# Patient Record
Sex: Male | Born: 1950 | Race: White | Hispanic: No | Marital: Single | Smoking: Former smoker
Health system: Southern US, Community
[De-identification: ages and names within clinical notes are randomized; demographics above are authoritative.]

## PROBLEM LIST (undated history)

## (undated) DIAGNOSIS — E785 Hyperlipidemia, unspecified: Secondary | ICD-10-CM

## (undated) HISTORY — PX: WISDOM TOOTH EXTRACTION: SHX21

## (undated) HISTORY — DX: Hyperlipidemia, unspecified: E78.5

---

## 2003-09-24 ENCOUNTER — Emergency Department (HOSPITAL_COMMUNITY): Admission: AC | Admit: 2003-09-24 | Discharge: 2003-09-24 | Payer: Self-pay

## 2005-03-04 IMAGING — CR DG ANKLE COMPLETE 3+V*L*
3 series · 3 of 3 positions shown · non-contrast
Comparison: none

CLINICAL DATA: Silver trauma with ankle pain.  
 LEFT ANKLE THREE VIEW, 09/24/03
 There is an oblique fracture noted through the left medial malleolus and a transverse fracture through the lateral malleolus.  Overlying soft tissue swelling is noted.  On the lateral view, there also appears to be a fracture through the posterior malleolus.  
 IMPRESSION
 Probable trimalleolar fracture as above.

[view not recorded (1 of 3)]
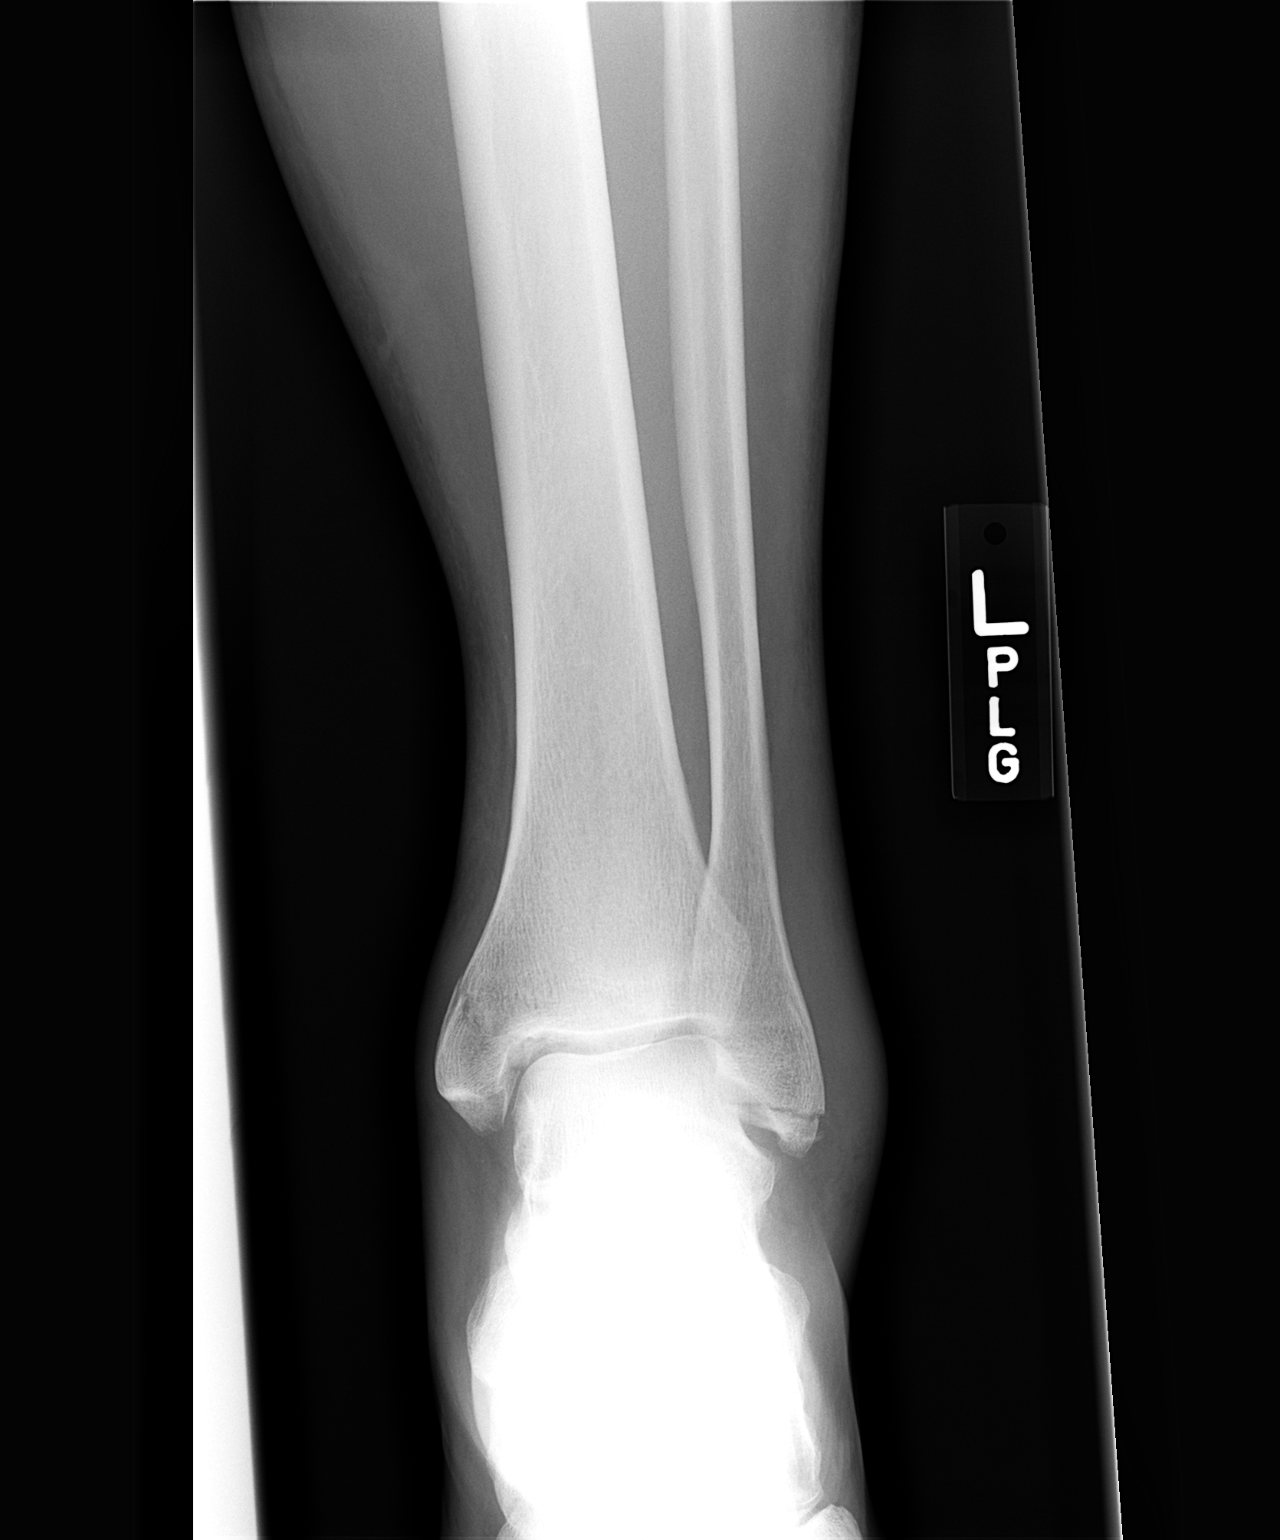

[view not recorded (2 of 3)]
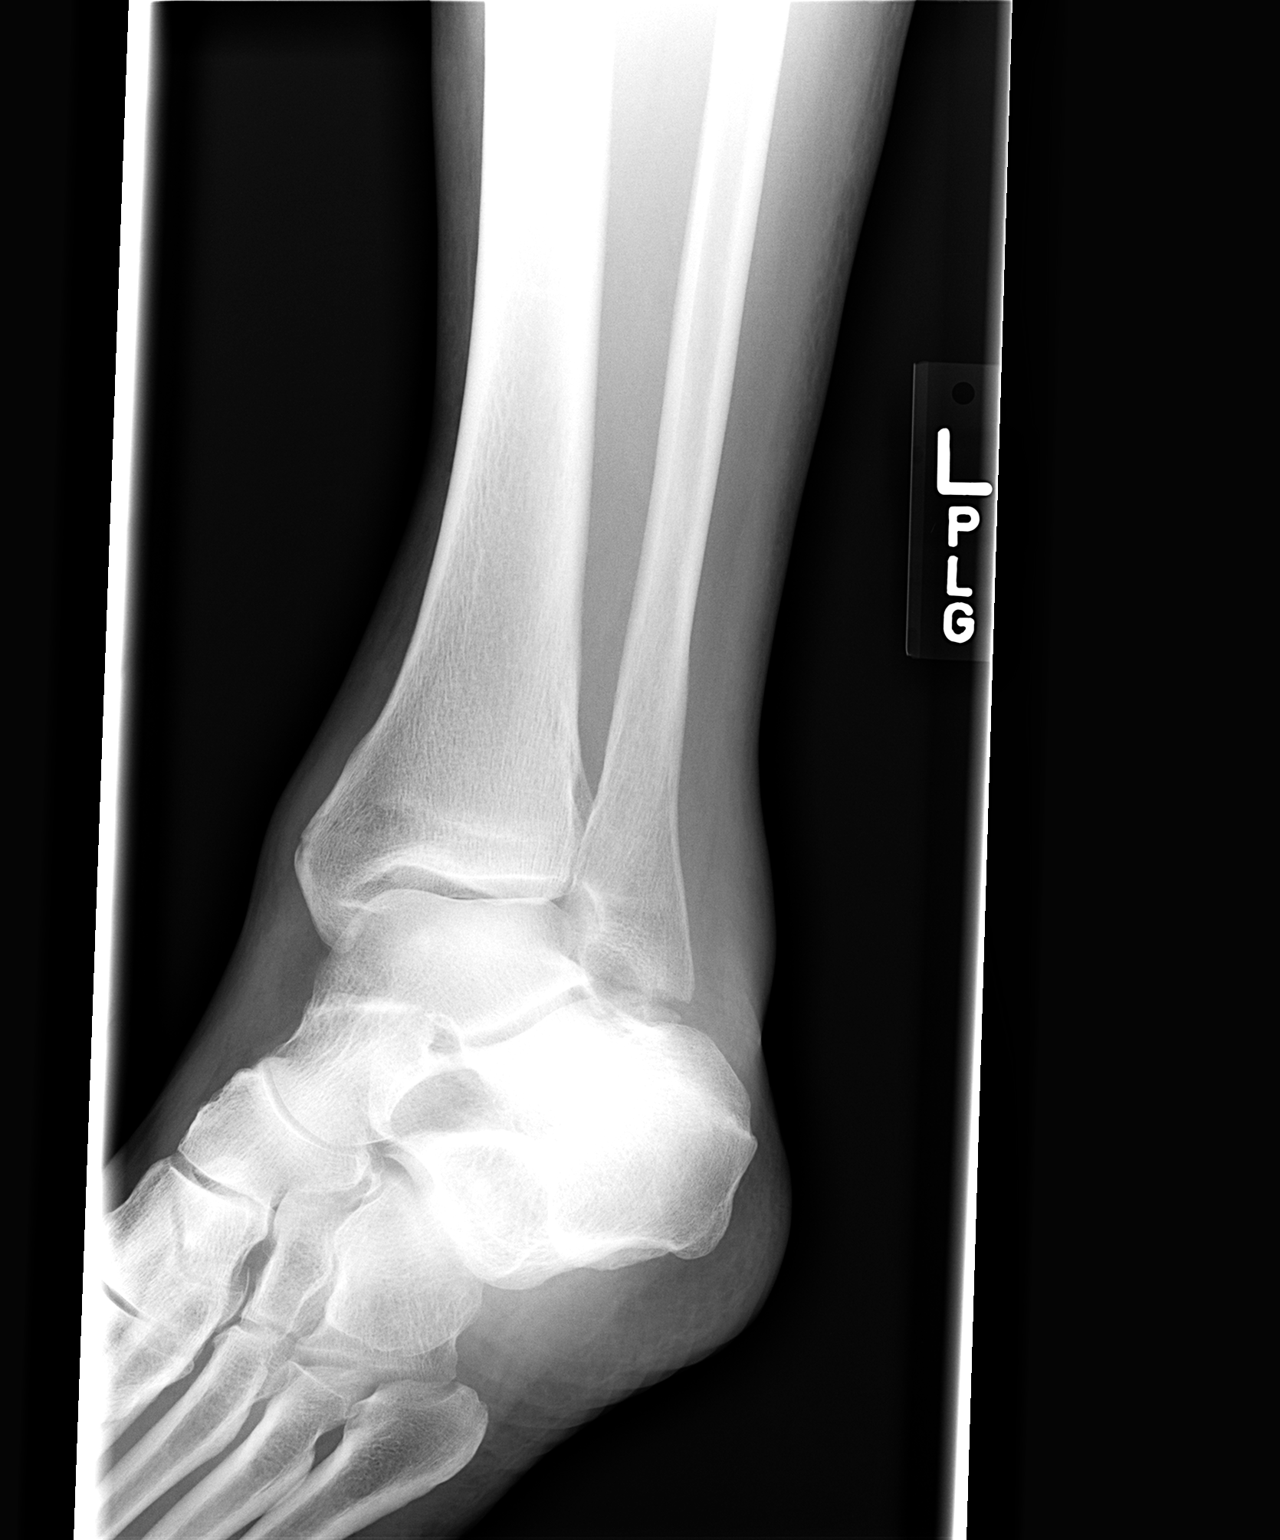

[view not recorded (3 of 3)]
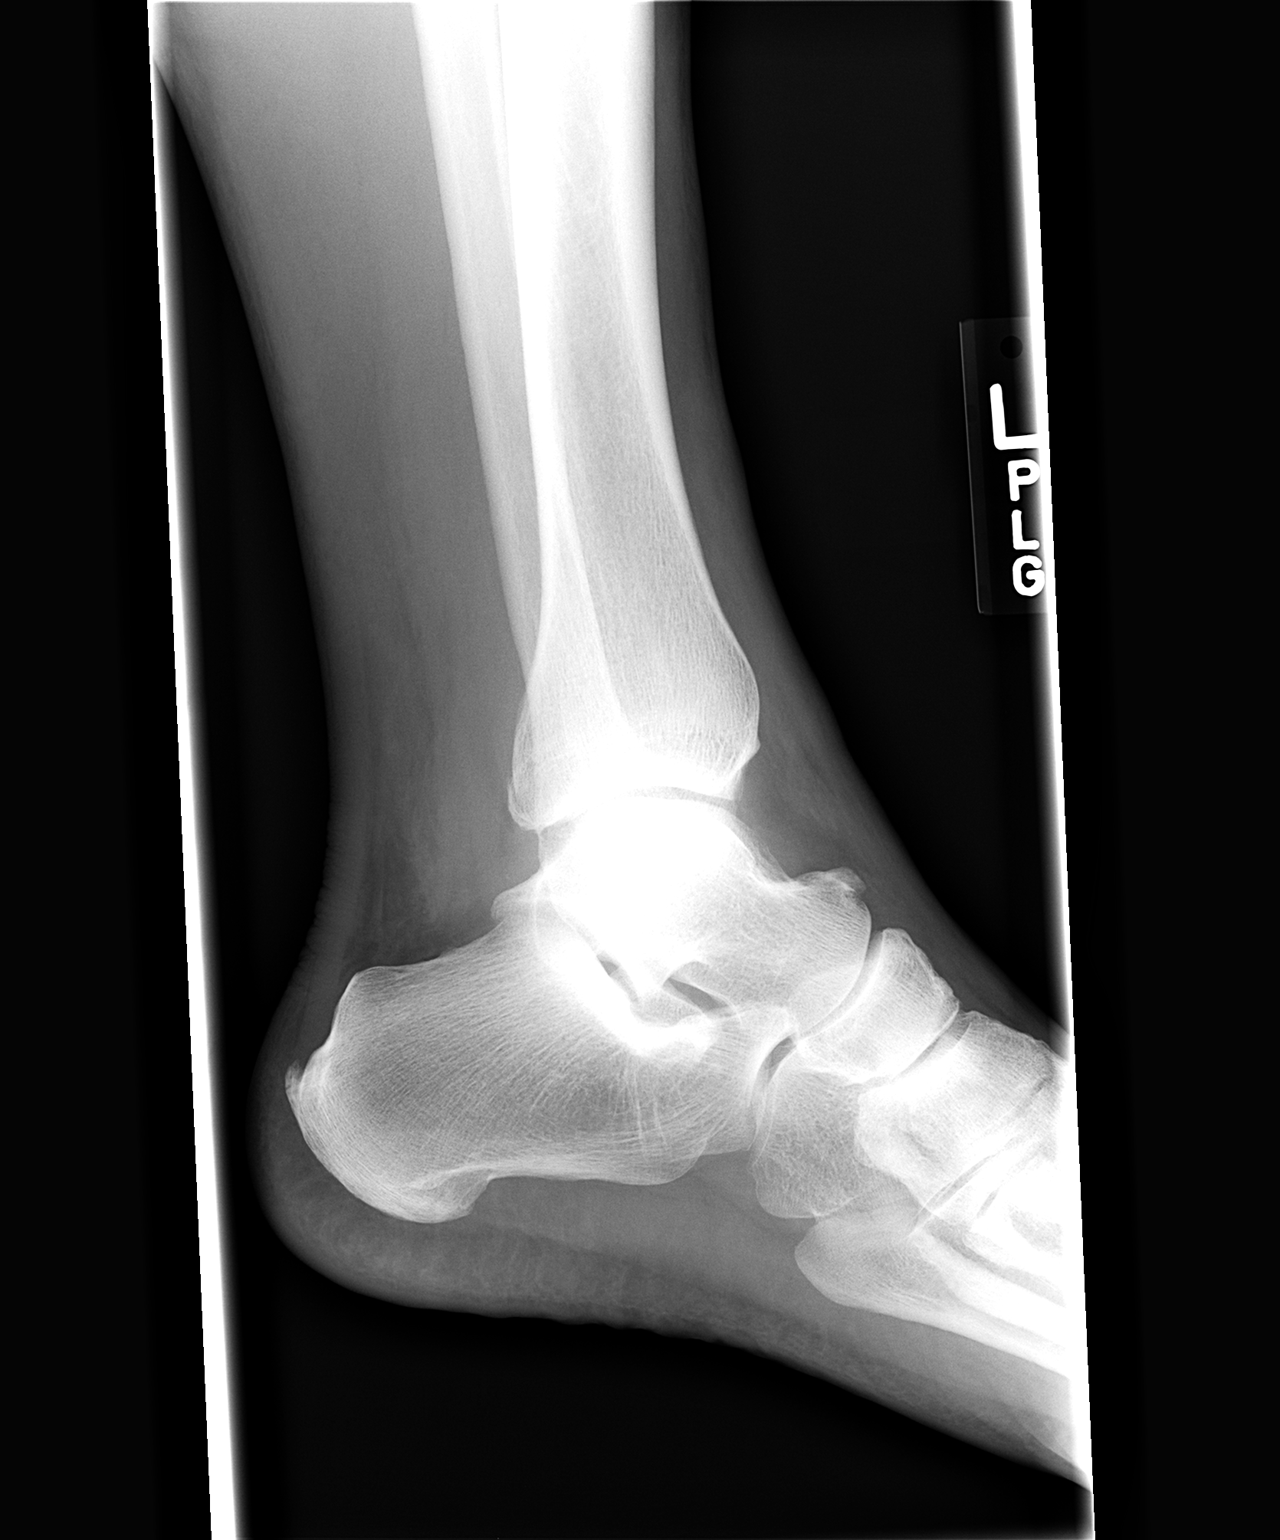

[3 of 3 positions shown; findings below may reference images not displayed]

## 2006-04-03 ENCOUNTER — Emergency Department (HOSPITAL_COMMUNITY): Admission: EM | Admit: 2006-04-03 | Discharge: 2006-04-03 | Payer: Self-pay | Admitting: Emergency Medicine

## 2021-02-20 DIAGNOSIS — E785 Hyperlipidemia, unspecified: Secondary | ICD-10-CM | POA: Diagnosis not present

## 2021-02-20 DIAGNOSIS — R339 Retention of urine, unspecified: Secondary | ICD-10-CM | POA: Diagnosis not present

## 2021-03-05 DIAGNOSIS — M1990 Unspecified osteoarthritis, unspecified site: Secondary | ICD-10-CM | POA: Diagnosis not present

## 2021-03-05 DIAGNOSIS — E782 Mixed hyperlipidemia: Secondary | ICD-10-CM | POA: Diagnosis not present

## 2021-03-05 DIAGNOSIS — F17209 Nicotine dependence, unspecified, with unspecified nicotine-induced disorders: Secondary | ICD-10-CM | POA: Diagnosis not present

## 2021-03-05 DIAGNOSIS — J329 Chronic sinusitis, unspecified: Secondary | ICD-10-CM | POA: Diagnosis not present

## 2022-02-28 DIAGNOSIS — Z0001 Encounter for general adult medical examination with abnormal findings: Secondary | ICD-10-CM | POA: Diagnosis not present

## 2022-02-28 DIAGNOSIS — R7301 Impaired fasting glucose: Secondary | ICD-10-CM | POA: Diagnosis not present

## 2022-02-28 DIAGNOSIS — I10 Essential (primary) hypertension: Secondary | ICD-10-CM | POA: Diagnosis not present

## 2022-02-28 DIAGNOSIS — E785 Hyperlipidemia, unspecified: Secondary | ICD-10-CM | POA: Diagnosis not present

## 2022-03-06 DIAGNOSIS — E875 Hyperkalemia: Secondary | ICD-10-CM | POA: Diagnosis not present

## 2022-03-06 DIAGNOSIS — E785 Hyperlipidemia, unspecified: Secondary | ICD-10-CM | POA: Diagnosis not present

## 2022-03-06 DIAGNOSIS — R7303 Prediabetes: Secondary | ICD-10-CM | POA: Diagnosis not present

## 2022-03-06 DIAGNOSIS — Z0001 Encounter for general adult medical examination with abnormal findings: Secondary | ICD-10-CM | POA: Diagnosis not present

## 2022-09-08 ENCOUNTER — Other Ambulatory Visit: Payer: Self-pay | Admitting: Nurse Practitioner

## 2022-10-08 ENCOUNTER — Ambulatory Visit: Payer: Medicare Other | Admitting: Nurse Practitioner

## 2022-10-13 ENCOUNTER — Ambulatory Visit (INDEPENDENT_AMBULATORY_CARE_PROVIDER_SITE_OTHER): Payer: Medicare Other | Admitting: Nurse Practitioner

## 2022-10-13 VITALS — BP 134/84 | HR 78 | Ht 71.0 in | Wt 186.6 lb

## 2022-10-13 DIAGNOSIS — D229 Melanocytic nevi, unspecified: Secondary | ICD-10-CM | POA: Diagnosis not present

## 2022-10-13 NOTE — Patient Instructions (Signed)
1) Derm referral

## 2022-10-13 NOTE — Progress Notes (Signed)
   Established Patient Office Visit  Subjective:  Patient ID: Jeremy Barrett, male    DOB: [birth year]  Age: 72 y.o. MRN: [id number]  No chief complaint on file.   Acute visit.  Concerned with several discolored nevi.  Family hx.    No other concerns at this time.   No past medical history on file.   Social History   Socioeconomic History   Marital status: Single    Spouse name: Not on file   Number of children: Not on file   Years of education: Not on file   Highest education level: Not on file  Occupational History   Not on file  Tobacco Use   Smoking status: Not on file   Smokeless tobacco: Not on file  Substance and Sexual Activity   Alcohol use: Not on file   Drug use: Not on file   Sexual activity: Not on file  Other Topics Concern   Not on file  Social History Narrative   Not on file   Social Determinants of Health   Financial Resource Strain: Not on file  Food Insecurity: Not on file  Transportation Needs: Not on file  Physical Activity: Not on file  Stress: Not on file  Social Connections: Not on file  Intimate Partner Violence: Not on file    No family history on file.  Not on File  Review of Systems  Constitutional: Negative.   HENT: Negative.    Eyes: Negative.   Respiratory: Negative.    Cardiovascular: Negative.   Gastrointestinal: Negative.   Genitourinary: Negative.   Musculoskeletal: Negative.   Skin: Negative.   Neurological: Negative.   Endo/Heme/Allergies: Negative.   Psychiatric/Behavioral: Negative.         Objective:   There were no vitals taken for this visit.  There were no vitals filed for this visit.  Physical Exam Vitals reviewed.  Constitutional:      Appearance: Normal appearance.  HENT:     Head: Normocephalic.     Nose: Nose normal.     Mouth/Throat:     Mouth: Mucous membranes are moist.  Eyes:     Pupils: Pupils are equal, round, and reactive to light.  Cardiovascular:     Rate and Rhythm:  Normal rate and regular rhythm.  Pulmonary:     Effort: Pulmonary effort is normal.     Breath sounds: Normal breath sounds.  Abdominal:     General: Bowel sounds are normal.     Palpations: Abdomen is soft.  Musculoskeletal:        General: Normal range of motion.     Cervical back: Normal range of motion and neck supple.  Skin:    Findings: Erythema present.  Neurological:     Mental Status: He is alert and oriented to person, place, and time.  Psychiatric:        Mood and Affect: Mood normal.        Behavior: Behavior normal.      No results found for any visits on 10/13/22.  No results found for this or any previous visit (from the past 2160 hour(s)).    Assessment & Plan:   Problem List Items Addressed This Visit   None   No follow-ups on file.   Total time spent: 15 minutes  Orson Eva, NP  10/13/2022

## 2022-10-14 ENCOUNTER — Other Ambulatory Visit: Payer: Self-pay | Admitting: Nurse Practitioner

## 2022-10-14 DIAGNOSIS — D229 Melanocytic nevi, unspecified: Secondary | ICD-10-CM

## 2022-11-12 DIAGNOSIS — D225 Melanocytic nevi of trunk: Secondary | ICD-10-CM | POA: Diagnosis not present

## 2022-11-12 DIAGNOSIS — D485 Neoplasm of uncertain behavior of skin: Secondary | ICD-10-CM | POA: Diagnosis not present

## 2022-11-12 DIAGNOSIS — L814 Other melanin hyperpigmentation: Secondary | ICD-10-CM | POA: Diagnosis not present

## 2022-11-12 DIAGNOSIS — L820 Inflamed seborrheic keratosis: Secondary | ICD-10-CM | POA: Diagnosis not present

## 2022-11-12 DIAGNOSIS — L578 Other skin changes due to chronic exposure to nonionizing radiation: Secondary | ICD-10-CM | POA: Diagnosis not present

## 2022-12-03 DIAGNOSIS — D485 Neoplasm of uncertain behavior of skin: Secondary | ICD-10-CM | POA: Diagnosis not present

## 2022-12-16 DIAGNOSIS — D485 Neoplasm of uncertain behavior of skin: Secondary | ICD-10-CM | POA: Diagnosis not present

## 2022-12-19 ENCOUNTER — Other Ambulatory Visit: Payer: Self-pay | Admitting: Nurse Practitioner

## 2023-02-26 ENCOUNTER — Other Ambulatory Visit: Payer: Medicare Other

## 2023-02-26 DIAGNOSIS — R7301 Impaired fasting glucose: Secondary | ICD-10-CM | POA: Diagnosis not present

## 2023-02-26 DIAGNOSIS — I10 Essential (primary) hypertension: Secondary | ICD-10-CM

## 2023-02-26 DIAGNOSIS — E785 Hyperlipidemia, unspecified: Secondary | ICD-10-CM | POA: Diagnosis not present

## 2023-02-26 DIAGNOSIS — E669 Obesity, unspecified: Secondary | ICD-10-CM

## 2023-02-27 LAB — CMP14+EGFR
ALT: 21 IU/L (ref 0–44)
AST: 25 IU/L (ref 0–40)
Albumin: 4.4 g/dL (ref 3.8–4.8)
Alkaline Phosphatase: 84 IU/L (ref 44–121)
BUN/Creatinine Ratio: 6 — ABNORMAL LOW (ref 10–24)
BUN: 6 mg/dL — ABNORMAL LOW (ref 8–27)
Bilirubin Total: 0.3 mg/dL (ref 0.0–1.2)
CO2: 24 mmol/L (ref 20–29)
Calcium: 9.2 mg/dL (ref 8.6–10.2)
Chloride: 102 mmol/L (ref 96–106)
Creatinine, Ser: 1.01 mg/dL (ref 0.76–1.27)
Globulin, Total: 2.1 g/dL (ref 1.5–4.5)
Glucose: 75 mg/dL (ref 70–99)
Potassium: 5.1 mmol/L (ref 3.5–5.2)
Sodium: 142 mmol/L (ref 134–144)
Total Protein: 6.5 g/dL (ref 6.0–8.5)
eGFR: 79 mL/min/{1.73_m2} (ref >59)

## 2023-02-27 LAB — CBC WITH DIFFERENTIAL/PLATELET
Basophils Absolute: 0 10*3/uL (ref 0.0–0.2)
Basos: 1 %
EOS (ABSOLUTE): 0.1 10*3/uL (ref 0.0–0.4)
Eos: 1 %
Hematocrit: 46.4 % (ref 37.5–51.0)
Hemoglobin: 15.3 g/dL (ref 13.0–17.7)
Immature Grans (Abs): 0 10*3/uL (ref 0.0–0.1)
Immature Granulocytes: 0 %
Lymphocytes Absolute: 2.3 10*3/uL (ref 0.7–3.1)
Lymphs: 38 %
MCH: 31.1 pg (ref 26.6–33.0)
MCHC: 33 g/dL (ref 31.5–35.7)
MCV: 94 fL (ref 79–97)
Monocytes Absolute: 0.5 10*3/uL (ref 0.1–0.9)
Monocytes: 8 %
Neutrophils Absolute: 3.1 10*3/uL (ref 1.4–7.0)
Neutrophils: 52 %
Platelets: 322 10*3/uL (ref 150–450)
RBC: 4.92 x10E6/uL (ref 4.14–5.80)
RDW: 12.3 % (ref 11.6–15.4)
WBC: 6 10*3/uL (ref 3.4–10.8)

## 2023-02-27 LAB — LIPID PANEL
Chol/HDL Ratio: 1.9 ratio (ref 0.0–5.0)
Cholesterol, Total: 175 mg/dL (ref 100–199)
HDL: 93 mg/dL (ref >39)
LDL Chol Calc (NIH): 70 mg/dL (ref 0–99)
Triglycerides: 60 mg/dL (ref 0–149)
VLDL Cholesterol Cal: 12 mg/dL (ref 5–40)

## 2023-02-27 LAB — HEMOGLOBIN A1C
Est. average glucose Bld gHb Est-mCnc: 111 mg/dL
Hgb A1c MFr Bld: 5.5 % (ref 4.8–5.6)

## 2023-02-27 LAB — TSH: TSH: 2.47 u[IU]/mL (ref 0.450–4.500)

## 2023-03-06 ENCOUNTER — Ambulatory Visit (INDEPENDENT_AMBULATORY_CARE_PROVIDER_SITE_OTHER): Payer: Medicare Other | Admitting: Cardiology

## 2023-03-06 ENCOUNTER — Encounter: Payer: Self-pay | Admitting: Cardiology

## 2023-03-06 VITALS — BP 120/72 | HR 85 | Ht 72.0 in | Wt 187.4 lb

## 2023-03-06 DIAGNOSIS — Z1211 Encounter for screening for malignant neoplasm of colon: Secondary | ICD-10-CM | POA: Diagnosis not present

## 2023-03-06 DIAGNOSIS — E782 Mixed hyperlipidemia: Secondary | ICD-10-CM | POA: Diagnosis not present

## 2023-03-06 MED ORDER — ROSUVASTATIN CALCIUM 20 MG PO TABS: 20.0000 mg | ORAL_TABLET | Freq: Every day | ORAL | 3 refills | Status: AC

## 2023-03-06 NOTE — Progress Notes (Signed)
Established Patient Office Visit  Subjective:  Patient ID: Jeremy Barrett, male    DOB: 24-Apr-1951  Age: 72 y.o. MRN: [id number]  Chief Complaint  Patient presents with   Annual Exam    1 year follow up    Patient in office for 1 year follow up. Patient reports feeling well, "borderline incredible". No complaints today. Discussed recent lab work. LDL at goal. Hgb A1c normal. No change in medications.  Cologuard 03/2018 negative. Will repeat test.     No other concerns at this time.   Past Medical History:  Diagnosis Date   Hyperlipidemia     Past Surgical History:  Procedure Laterality Date   WISDOM TOOTH EXTRACTION      Social History   Socioeconomic History   Marital status: Single    Spouse name: Not on file   Number of children: Not on file   Years of education: Not on file   Highest education level: Not on file  Occupational History   Not on file  Tobacco Use   Smoking status: Not on file   Smokeless tobacco: Not on file  Substance and Sexual Activity   Alcohol use: Not on file   Drug use: Not on file   Sexual activity: Not on file  Other Topics Concern   Not on file  Social History Narrative   Not on file   Social Determinants of Health   Financial Resource Strain: Not on file  Food Insecurity: Not on file  Transportation Needs: Not on file  Physical Activity: Not on file  Stress: Not on file  Social Connections: Not on file  Intimate Partner Violence: Not on file    History reviewed. No pertinent family history.  No Known Allergies  Review of Systems  Constitutional: Negative.   HENT: Negative.    Eyes: Negative.   Respiratory: Negative.  Negative for shortness of breath.   Cardiovascular: Negative.  Negative for chest pain.  Gastrointestinal: Negative.  Negative for abdominal pain, constipation and diarrhea.  Genitourinary: Negative.   Musculoskeletal:  Negative for joint pain and myalgias.  Skin: Negative.   Neurological:  Negative.  Negative for dizziness and headaches.  Endo/Heme/Allergies: Negative.   All other systems reviewed and are negative.      Objective:   BP 120/72   Pulse 85   Ht 6' (1.829 m)   Wt 187 lb 6.4 oz (85 kg)   SpO2 97%   BMI 25.42 kg/m   Vitals:   03/06/23 1305  BP: 120/72  Pulse: 85  Height: 6' (1.829 m)  Weight: 187 lb 6.4 oz (85 kg)  SpO2: 97%  BMI (Calculated): 25.41    Physical Exam Nursing note reviewed.  Constitutional:      Appearance: Normal appearance. He is normal weight.  HENT:     Head: Normocephalic and atraumatic.     Nose: Nose normal.     Mouth/Throat:     Mouth: Mucous membranes are moist.     Pharynx: Oropharynx is clear.  Eyes:     Extraocular Movements: Extraocular movements intact.     Conjunctiva/sclera: Conjunctivae normal.     Pupils: Pupils are equal, round, and reactive to light.  Cardiovascular:     Rate and Rhythm: Normal rate and regular rhythm.     Pulses: Normal pulses.     Heart sounds: Normal heart sounds.  Pulmonary:     Effort: Pulmonary effort is normal.     Breath sounds: Normal breath sounds.  Abdominal:     General: Abdomen is flat. Bowel sounds are normal.     Palpations: Abdomen is soft.  Musculoskeletal:        General: Normal range of motion.     Cervical back: Normal range of motion.  Skin:    General: Skin is warm and dry.  Neurological:     General: No focal deficit present.     Mental Status: He is alert and oriented to person, place, and time.  Psychiatric:        Mood and Affect: Mood normal.        Behavior: Behavior normal.        Thought Content: Thought content normal.        Judgment: Judgment normal.      No results found for any visits on 03/06/23.  Recent Results (from the past 2160 hour(s))  Hemoglobin A1c     Status: None   Collection Time: 02/26/23 11:46 AM  Result Value Ref Range   Hgb A1c MFr Bld 5.5 4.8 - 5.6 %    Comment:          Prediabetes: 5.7 - 6.4          Diabetes:  >6.4          Glycemic control for adults with diabetes: <7.0    Est. average glucose Bld gHb Est-mCnc 111 mg/dL  TSH     Status: None   Collection Time: 02/26/23 11:46 AM  Result Value Ref Range   TSH 2.470 0.450 - 4.500 uIU/mL  CMP14+EGFR     Status: Abnormal   Collection Time: 02/26/23 11:46 AM  Result Value Ref Range   Glucose 75 70 - 99 mg/dL   BUN 6 (L) 8 - 27 mg/dL   Creatinine, Ser 4.09 0.76 - 1.27 mg/dL   eGFR 79 >81 XB/JYN/8.29   BUN/Creatinine Ratio 6 (L) 10 - 24   Sodium 142 134 - 144 mmol/L   Potassium 5.1 3.5 - 5.2 mmol/L   Chloride 102 96 - 106 mmol/L   CO2 24 20 - 29 mmol/L   Calcium 9.2 8.6 - 10.2 mg/dL   Total Protein 6.5 6.0 - 8.5 g/dL   Albumin 4.4 3.8 - 4.8 g/dL   Globulin, Total 2.1 1.5 - 4.5 g/dL   Bilirubin Total 0.3 0.0 - 1.2 mg/dL   Alkaline Phosphatase 84 44 - 121 IU/L   AST 25 0 - 40 IU/L   ALT 21 0 - 44 IU/L  Lipid panel     Status: None   Collection Time: 02/26/23 11:46 AM  Result Value Ref Range   Cholesterol, Total 175 100 - 199 mg/dL   Triglycerides 60 0 - 149 mg/dL   HDL 93 >56 mg/dL   VLDL Cholesterol Cal 12 5 - 40 mg/dL   LDL Chol Calc (NIH) 70 0 - 99 mg/dL   Chol/HDL Ratio 1.9 0.0 - 5.0 ratio    Comment:                                   T. Chol/HDL Ratio                                             Men  Women  1/2 Avg.Risk  3.4    3.3                                   Avg.Risk  5.0    4.4                                2X Avg.Risk  9.6    7.1                                3X Avg.Risk 23.4   11.0   CBC with Diff     Status: None   Collection Time: 02/26/23 11:46 AM  Result Value Ref Range   WBC 6.0 3.4 - 10.8 x10E3/uL   RBC 4.92 4.14 - 5.80 x10E6/uL   Hemoglobin 15.3 13.0 - 17.7 g/dL   Hematocrit 62.1 30.8 - 51.0 %   MCV 94 79 - 97 fL   MCH 31.1 26.6 - 33.0 pg   MCHC 33.0 31.5 - 35.7 g/dL   RDW 65.7 84.6 - 96.2 %   Platelets 322 150 - 450 x10E3/uL   Neutrophils 52 Not Estab. %   Lymphs 38 Not  Estab. %   Monocytes 8 Not Estab. %   Eos 1 Not Estab. %   Basos 1 Not Estab. %   Neutrophils Absolute 3.1 1.4 - 7.0 x10E3/uL   Lymphocytes Absolute 2.3 0.7 - 3.1 x10E3/uL   Monocytes Absolute 0.5 0.1 - 0.9 x10E3/uL   EOS (ABSOLUTE) 0.1 0.0 - 0.4 x10E3/uL   Basophils Absolute 0.0 0.0 - 0.2 x10E3/uL   Immature Granulocytes 0 Not Estab. %   Immature Grans (Abs) 0.0 0.0 - 0.1 x10E3/uL      Assessment & Plan:  Continue rosuvastatin.  Cologuard ordered.  Fasting labs prior to next visit.   Problem List Items Addressed This Visit       Other   Mixed hyperlipidemia - Primary   Other Visit Diagnoses     Colon cancer screening       Relevant Orders   Cologuard       Return in about 1 year (around 03/05/2024) for with fasting lab work prior.   Total time spent: 25 minutes  [employer], NP  03/06/2023   This document may have been prepared by Dragon Voice Recognition software and as such may include unintentional dictation errors.

## 2023-03-24 ENCOUNTER — Telehealth: Payer: Self-pay

## 2023-03-24 ENCOUNTER — Other Ambulatory Visit: Payer: Self-pay | Admitting: Cardiology

## 2023-03-24 DIAGNOSIS — Z1211 Encounter for screening for malignant neoplasm of colon: Secondary | ICD-10-CM

## 2023-03-24 NOTE — Telephone Encounter (Signed)
Can you reput in order for patients cologuard, we had the wrong address on file so he never received the box

## 2023-05-19 DIAGNOSIS — D485 Neoplasm of uncertain behavior of skin: Secondary | ICD-10-CM | POA: Diagnosis not present

## 2023-10-07 ENCOUNTER — Ambulatory Visit: Payer: Self-pay | Admitting: Dermatology

## 2023-11-16 DIAGNOSIS — L821 Other seborrheic keratosis: Secondary | ICD-10-CM | POA: Diagnosis not present

## 2023-11-16 DIAGNOSIS — L814 Other melanin hyperpigmentation: Secondary | ICD-10-CM | POA: Diagnosis not present

## 2023-11-16 DIAGNOSIS — D485 Neoplasm of uncertain behavior of skin: Secondary | ICD-10-CM | POA: Diagnosis not present

## 2023-11-16 DIAGNOSIS — L578 Other skin changes due to chronic exposure to nonionizing radiation: Secondary | ICD-10-CM | POA: Diagnosis not present

## 2023-11-16 DIAGNOSIS — D225 Melanocytic nevi of trunk: Secondary | ICD-10-CM | POA: Diagnosis not present

## 2023-11-30 DIAGNOSIS — D485 Neoplasm of uncertain behavior of skin: Secondary | ICD-10-CM | POA: Diagnosis not present

## 2024-02-24 DIAGNOSIS — R7309 Other abnormal glucose: Secondary | ICD-10-CM | POA: Diagnosis not present

## 2024-02-24 DIAGNOSIS — Z79899 Other long term (current) drug therapy: Secondary | ICD-10-CM | POA: Diagnosis not present

## 2024-02-24 DIAGNOSIS — E782 Mixed hyperlipidemia: Secondary | ICD-10-CM | POA: Diagnosis not present

## 2024-02-24 DIAGNOSIS — E038 Other specified hypothyroidism: Secondary | ICD-10-CM | POA: Diagnosis not present

## 2024-02-24 DIAGNOSIS — E559 Vitamin D deficiency, unspecified: Secondary | ICD-10-CM | POA: Diagnosis not present

## 2024-02-24 DIAGNOSIS — Z0000 Encounter for general adult medical examination without abnormal findings: Secondary | ICD-10-CM | POA: Diagnosis not present

## 2024-02-24 DIAGNOSIS — D519 Vitamin B12 deficiency anemia, unspecified: Secondary | ICD-10-CM | POA: Diagnosis not present

## 2024-03-04 DIAGNOSIS — D519 Vitamin B12 deficiency anemia, unspecified: Secondary | ICD-10-CM | POA: Diagnosis not present

## 2024-03-04 DIAGNOSIS — E559 Vitamin D deficiency, unspecified: Secondary | ICD-10-CM | POA: Diagnosis not present

## 2024-03-04 DIAGNOSIS — E782 Mixed hyperlipidemia: Secondary | ICD-10-CM | POA: Diagnosis not present

## 2024-03-04 DIAGNOSIS — E038 Other specified hypothyroidism: Secondary | ICD-10-CM | POA: Diagnosis not present

## 2024-03-07 ENCOUNTER — Ambulatory Visit: Payer: Medicare Other | Admitting: Cardiology

## 2024-03-24 DIAGNOSIS — E038 Other specified hypothyroidism: Secondary | ICD-10-CM | POA: Diagnosis not present

## 2024-03-24 DIAGNOSIS — D519 Vitamin B12 deficiency anemia, unspecified: Secondary | ICD-10-CM | POA: Diagnosis not present

## 2024-03-24 DIAGNOSIS — E559 Vitamin D deficiency, unspecified: Secondary | ICD-10-CM | POA: Diagnosis not present

## 2024-03-24 DIAGNOSIS — R0602 Shortness of breath: Secondary | ICD-10-CM | POA: Diagnosis not present

## 2024-03-24 DIAGNOSIS — R7309 Other abnormal glucose: Secondary | ICD-10-CM | POA: Diagnosis not present

## 2024-03-24 DIAGNOSIS — I5021 Acute systolic (congestive) heart failure: Secondary | ICD-10-CM | POA: Diagnosis not present

## 2024-03-24 DIAGNOSIS — Z79899 Other long term (current) drug therapy: Secondary | ICD-10-CM | POA: Diagnosis not present

## 2024-03-24 DIAGNOSIS — R079 Chest pain, unspecified: Secondary | ICD-10-CM | POA: Diagnosis not present

## 2024-03-24 DIAGNOSIS — E782 Mixed hyperlipidemia: Secondary | ICD-10-CM | POA: Diagnosis not present

## 2024-04-04 DIAGNOSIS — D225 Melanocytic nevi of trunk: Secondary | ICD-10-CM | POA: Diagnosis not present

## 2024-04-04 DIAGNOSIS — D485 Neoplasm of uncertain behavior of skin: Secondary | ICD-10-CM | POA: Diagnosis not present

## 2024-04-04 DIAGNOSIS — L820 Inflamed seborrheic keratosis: Secondary | ICD-10-CM | POA: Diagnosis not present

## 2024-04-04 DIAGNOSIS — L814 Other melanin hyperpigmentation: Secondary | ICD-10-CM | POA: Diagnosis not present
